# Patient Record
Sex: Male | Born: 1955
Health system: Southern US, Community
[De-identification: ages and names within clinical notes are randomized; demographics above are authoritative.]

## PROBLEM LIST (undated history)

## (undated) DIAGNOSIS — I509 Heart failure, unspecified: Secondary | ICD-10-CM

## (undated) DIAGNOSIS — I10 Essential (primary) hypertension: Secondary | ICD-10-CM

## (undated) DIAGNOSIS — D494 Neoplasm of unspecified behavior of bladder: Secondary | ICD-10-CM

## (undated) HISTORY — PX: HERNIA REPAIR: SHX51

## (undated) HISTORY — DX: Essential (primary) hypertension: I10

## (undated) HISTORY — DX: Heart failure, unspecified: I50.9

## (undated) HISTORY — DX: Neoplasm of unspecified behavior of bladder: D49.4

## (undated) HISTORY — PX: CARDIAC CATHETERIZATION: SHX172

---

## 2019-07-01 ENCOUNTER — Other Ambulatory Visit: Payer: Self-pay

## 2019-07-01 DIAGNOSIS — Z20822 Contact with and (suspected) exposure to covid-19: Secondary | ICD-10-CM

## 2019-07-03 LAB — NOVEL CORONAVIRUS, NAA: SARS-CoV-2, NAA: NOT DETECTED

## 2019-07-05 ENCOUNTER — Telehealth: Payer: Self-pay

## 2019-07-05 NOTE — Telephone Encounter (Signed)
Pt aware covid lab test negative, not detected °

## 2019-08-26 ENCOUNTER — Other Ambulatory Visit: Payer: Self-pay

## 2019-08-26 DIAGNOSIS — Z20822 Contact with and (suspected) exposure to covid-19: Secondary | ICD-10-CM

## 2019-08-28 LAB — NOVEL CORONAVIRUS, NAA: SARS-CoV-2, NAA: NOT DETECTED

## 2019-10-23 ENCOUNTER — Ambulatory Visit: Payer: Self-pay | Attending: Internal Medicine

## 2019-10-23 DIAGNOSIS — Z20822 Contact with and (suspected) exposure to covid-19: Secondary | ICD-10-CM | POA: Insufficient documentation

## 2019-10-25 LAB — NOVEL CORONAVIRUS, NAA: SARS-CoV-2, NAA: NOT DETECTED

## 2019-12-12 DIAGNOSIS — I10 Essential (primary) hypertension: Secondary | ICD-10-CM | POA: Insufficient documentation

## 2019-12-12 DIAGNOSIS — M25569 Pain in unspecified knee: Secondary | ICD-10-CM | POA: Insufficient documentation

## 2019-12-12 DIAGNOSIS — Z720 Tobacco use: Secondary | ICD-10-CM | POA: Insufficient documentation

## 2019-12-12 DIAGNOSIS — I509 Heart failure, unspecified: Secondary | ICD-10-CM | POA: Insufficient documentation

## 2020-02-27 DIAGNOSIS — R748 Abnormal levels of other serum enzymes: Secondary | ICD-10-CM | POA: Insufficient documentation

## 2020-02-27 DIAGNOSIS — R739 Hyperglycemia, unspecified: Secondary | ICD-10-CM | POA: Insufficient documentation

## 2021-04-08 ENCOUNTER — Emergency Department (HOSPITAL_COMMUNITY)
Admission: EM | Admit: 2021-04-08 | Discharge: 2021-04-08 | Disposition: A | Discharge status: Home / Self Care | Payer: PRIVATE HEALTH INSURANCE | Attending: Emergency Medicine | Admitting: Emergency Medicine

## 2021-04-08 ENCOUNTER — Encounter (HOSPITAL_COMMUNITY): Payer: Self-pay

## 2021-04-08 ENCOUNTER — Emergency Department (HOSPITAL_COMMUNITY): Payer: PRIVATE HEALTH INSURANCE

## 2021-04-08 ENCOUNTER — Other Ambulatory Visit: Payer: Self-pay

## 2021-04-08 ENCOUNTER — Ambulatory Visit (HOSPITAL_COMMUNITY)
Admission: EM | Admit: 2021-04-08 | Discharge: 2021-04-08 | Disposition: A | Discharge status: Home / Self Care | Payer: Self-pay | Attending: Physician Assistant | Admitting: Physician Assistant

## 2021-04-08 DIAGNOSIS — I509 Heart failure, unspecified: Secondary | ICD-10-CM | POA: Insufficient documentation

## 2021-04-08 DIAGNOSIS — R319 Hematuria, unspecified: Secondary | ICD-10-CM | POA: Insufficient documentation

## 2021-04-08 DIAGNOSIS — N3001 Acute cystitis with hematuria: Secondary | ICD-10-CM

## 2021-04-08 DIAGNOSIS — R310 Gross hematuria: Secondary | ICD-10-CM | POA: Insufficient documentation

## 2021-04-08 DIAGNOSIS — I110 Hypertensive heart disease with heart failure: Secondary | ICD-10-CM | POA: Insufficient documentation

## 2021-04-08 DIAGNOSIS — F17210 Nicotine dependence, cigarettes, uncomplicated: Secondary | ICD-10-CM | POA: Insufficient documentation

## 2021-04-08 DIAGNOSIS — R109 Unspecified abdominal pain: Secondary | ICD-10-CM | POA: Insufficient documentation

## 2021-04-08 LAB — COMPREHENSIVE METABOLIC PANEL
ALT: 22 U/L (ref 0–44)
AST: 27 U/L (ref 15–41)
Albumin: 4.7 g/dL (ref 3.5–5.0)
Alkaline Phosphatase: 61 U/L (ref 38–126)
Anion gap: 9 (ref 5–15)
BUN: 12 mg/dL (ref 8–23)
CO2: 26 mmol/L (ref 22–32)
Calcium: 9.6 mg/dL (ref 8.9–10.3)
Chloride: 107 mmol/L (ref 98–111)
Creatinine, Ser: 1.24 mg/dL (ref 0.61–1.24)
GFR, Estimated: >60 mL/min (ref >60)
Glucose, Bld: 90 mg/dL (ref 70–99)
Potassium: 3.3 mmol/L — ABNORMAL LOW (ref 3.5–5.1)
Sodium: 142 mmol/L (ref 135–145)
Total Bilirubin: 0.6 mg/dL (ref 0.3–1.2)
Total Protein: 7.7 g/dL (ref 6.5–8.1)

## 2021-04-08 LAB — URINALYSIS, ROUTINE W REFLEX MICROSCOPIC

## 2021-04-08 LAB — CBC WITH DIFFERENTIAL/PLATELET
Abs Immature Granulocytes: 0.01 10*3/uL (ref 0.00–0.07)
Basophils Absolute: 0 10*3/uL (ref 0.0–0.1)
Basophils Relative: 1 %
Eosinophils Absolute: 0.2 10*3/uL (ref 0.0–0.5)
Eosinophils Relative: 4 %
HCT: 44.1 % (ref 39.0–52.0)
Hemoglobin: 14.3 g/dL (ref 13.0–17.0)
Immature Granulocytes: 0 %
Lymphocytes Relative: 54 %
Lymphs Abs: 3 10*3/uL (ref 0.7–4.0)
MCH: 29.7 pg (ref 26.0–34.0)
MCHC: 32.4 g/dL (ref 30.0–36.0)
MCV: 91.5 fL (ref 80.0–100.0)
Monocytes Absolute: 0.5 10*3/uL (ref 0.1–1.0)
Monocytes Relative: 8 %
Neutro Abs: 1.9 10*3/uL (ref 1.7–7.7)
Neutrophils Relative %: 33 %
Platelets: 211 10*3/uL (ref 150–400)
RBC: 4.82 MIL/uL (ref 4.22–5.81)
RDW: 13.6 % (ref 11.5–15.5)
WBC: 5.6 10*3/uL (ref 4.0–10.5)
nRBC: 0 % (ref 0.0–0.2)

## 2021-04-08 LAB — URINALYSIS, MICROSCOPIC (REFLEX)
Bacteria, UA: NONE SEEN
RBC / HPF: >50 RBC/hpf (ref 0–5)

## 2021-04-08 LAB — POCT URINALYSIS DIPSTICK, ED / UC
Glucose, UA: 100 mg/dL — AB
Ketones, ur: 80 mg/dL — AB
Nitrite: POSITIVE — AB
Protein, ur: >=300 mg/dL — AB
Specific Gravity, Urine: 1.01 (ref 1.005–1.030)
Urobilinogen, UA: >=8 mg/dL (ref 0.0–1.0)
pH: 8.5 — ABNORMAL HIGH (ref 5.0–8.0)

## 2021-04-08 LAB — LIPASE, BLOOD: Lipase: 31 U/L (ref 11–51)

## 2021-04-08 MED ORDER — NICOTINE 21 MG/24HR TD PT24: 21.0000 mg | MEDICATED_PATCH | Freq: Every day | TRANSDERMAL | 0 refills | Status: AC

## 2021-04-08 MED ORDER — CEPHALEXIN 500 MG PO CAPS: 500.0000 mg | ORAL_CAPSULE | Freq: Four times a day (QID) | ORAL | 0 refills | Status: DC

## 2021-04-08 MED ORDER — SODIUM CHLORIDE 0.9 % IV SOLN
1.0000 g | Freq: Once | INTRAVENOUS | Status: AC
  Administered 2021-04-08: 1 g via INTRAVENOUS
  Filled 2021-04-08: qty 10

## 2021-04-08 NOTE — ED Provider Notes (Addendum)
[facility]    CSN: [id number] Arrival date & time: 04/08/21  5462      History   Chief Complaint Chief Complaint  Patient presents with   Flank Pain   Hematuria    HPI Jared Wilson is a 65 y.o. male.   Patient here c/w "blood in urine " x 3 weeks.  He reports it worsened last night.  Prior to three weeks he'd have intermittant history of red tinted urine, with one episiode of blood clots in urine last week.  Last night / this morning around 3 am he urinated and reports heavy levels of blood.  He urinated today in clinic for UA, urine sample was largely blood staining UA strip.  He reports some lower abdominal pressure, though very mild and he's not sure it's "not my mind playing tricks on me due to the blood."  He smokes 1 PPD x 40+ years.     Past Medical History:  Diagnosis Date   Congestive heart failure (CHF) ([place])    Hypertension     There are no problems to display for this patient.   History reviewed. No pertinent surgical history.     Home Medications    Prior to Admission medications   Not on File    Family History History reviewed. No pertinent family history.  Social History Social History   Tobacco Use   Smoking status: Every Day    Packs/day: 1.00    Pack years: 0.00    Types: Cigarettes   Smokeless tobacco: Never  Substance Use Topics   Alcohol use: Not Currently   Drug use: Never     Allergies   Patient has no known allergies.   Review of Systems Review of Systems  Constitutional:  Negative for chills, fatigue and fever.  Gastrointestinal:  Negative for abdominal pain, nausea and vomiting.  Genitourinary:  Positive for hematuria. Negative for dysuria, flank pain, frequency, penile discharge, scrotal swelling, testicular pain and urgency.  Musculoskeletal:  Negative for arthralgias and myalgias.  Neurological:  Negative for dizziness, light-headedness and headaches.  Hematological:  Negative for adenopathy. Does not  bruise/bleed easily.  Psychiatric/Behavioral:  Negative for sleep disturbance.     Physical Exam Triage Vital Signs ED Triage Vitals  Enc Vitals Group     BP 04/08/21 0927 (!) 137/105     Pulse Rate 04/08/21 0927 69     Resp 04/08/21 0927 17     Temp 04/08/21 0929 98.6 F (37 C)     Temp Source 04/08/21 0927 Oral     SpO2 04/08/21 0927 97 %     Weight --      Height --      Head Circumference --      Peak Flow --      Pain Score 04/08/21 0925 5     Pain Loc --      Pain Edu? --      Excl. in [place]? --    No data found.  Updated Vital Signs BP (!) 137/105 (BP Location: Right Arm)   Pulse 69   Temp 98.6 F (37 C) (Oral)   Resp 17   SpO2 97%   Visual Acuity Right Eye Distance:   Left Eye Distance:   Bilateral Distance:    Right Eye Near:   Left Eye Near:    Bilateral Near:     Physical Exam Vitals and nursing note reviewed.  Constitutional:      General: He is not in  acute distress.    Appearance: Normal appearance. He is well-developed. He is not ill-appearing or toxic-appearing.  HENT:     Head: Normocephalic and atraumatic.     Nose: Nose normal.     Mouth/Throat:     Mouth: Mucous membranes are moist.  Eyes:     General: No scleral icterus.    Conjunctiva/sclera: Conjunctivae normal.  Pulmonary:     Effort: Pulmonary effort is normal. No respiratory distress.  Abdominal:     General: There is no distension.     Tenderness: There is no abdominal tenderness. There is no right CVA tenderness, left CVA tenderness, guarding or rebound.  Musculoskeletal:     Cervical back: Normal range of motion and neck supple. No rigidity.  Skin:    General: Skin is warm and dry.     Capillary Refill: Capillary refill takes less than 2 seconds.  Neurological:     General: No focal deficit present.     Mental Status: He is alert and oriented to person, place, and time.     Motor: No weakness.     Gait: Gait normal.  Psychiatric:        Mood and Affect: Mood normal.         Behavior: Behavior normal.     UC Treatments / Results  Labs (all labs ordered are listed, but only abnormal results are displayed) Labs Reviewed  POCT URINALYSIS DIPSTICK, ED / UC - Abnormal; Notable for the following components:      Result Value   Glucose, UA 100 (*)    Bilirubin Urine LARGE (*)    Ketones, ur 80 (*)    Hgb urine dipstick LARGE (*)    pH 8.5 (*)    Protein, ur >=300 (*)    Nitrite POSITIVE (*)    Leukocytes,Ua LARGE (*)    All other components within normal limits  URINE CULTURE  URINALYSIS, ROUTINE W REFLEX MICROSCOPIC    EKG   Radiology No results found.  Procedures Procedures (including critical care time)  Medications Ordered in UC Medications - No data to display  Initial Impression / Assessment and Plan / UC Course  I have reviewed the triage vital signs and the nursing notes.  Pertinent labs & imaging results that were available during my care of the patient were reviewed by me and considered in my medical decision making (see chart for details).     Patient has painless gross hematuria in a smoker, I discussed with patient that he needs to see urology, concern with bladder / kidney CA Upon hearing my concerns he left to go to ED for further evaluation. Though urine notes leuk and nitrates, also shows pH 8.5 ketones, and glucose.  Strip is likely falsely positive, especially in the absence of Urinary sx.  Will run urine culture, contact patient if positive. Final Clinical Impressions(s) / UC Diagnoses   Final diagnoses:  Gross hematuria   Discharge Instructions   None    ED Prescriptions   None    PDMP not reviewed this encounter.   Peri Jefferson, PA-C 04/08/21 1015    Peri Jefferson, PA-C 04/08/21 1553

## 2021-04-08 NOTE — ED Provider Notes (Signed)
[facility] DEPT Provider Note   CSN: [id number] Arrival date & time: 04/08/21  1027     History Chief Complaint  Patient presents with   Hematuria    Jared Wilson is a 65 y.o. male.  HPI  Patient presents with painless hematuria x1 month.  States initially the urine was just mildly tented pink, but in the last 2 days it has become almost entirely blood.  He was concerned yesterday when he peed thick blood and had some flank cramping.  Went to urgent care, was told to follow-up with urology.  He was concerned about possible cancer so came to the ED for further work-up.  Flank pain is intermittent, started at the same time as the painless hematuria, sharp pain that last for seconds and is alleviated by moving.  No dysuria, no penile discharge, no rashes, no fever, known vomiting, no nausea.  Patient is an everyday smoker and has hypertension for which he takes medication.  No history of kidney stones  Past Medical History:  Diagnosis Date   Congestive heart failure (CHF) ([place])    Hypertension     There are no problems to display for this patient.   Past Surgical History:  Procedure Laterality Date   HERNIA REPAIR         Family History  Problem Relation Age of Onset   Deep vein thrombosis Mother     Social History   Tobacco Use   Smoking status: Every Day    Packs/day: 1.00    Pack years: 0.00    Types: Cigarettes   Smokeless tobacco: Never  Vaping Use   Vaping Use: Never used  Substance Use Topics   Alcohol use: Not Currently   Drug use: Never    Home Medications Prior to Admission medications   Not on File    Allergies    Patient has no known allergies.  Review of Systems   Review of Systems  Constitutional:  Negative for fever.  Respiratory:  Negative for cough and shortness of breath.   Cardiovascular:  Negative for chest pain and leg swelling.  Gastrointestinal:  Negative for abdominal pain, anal bleeding,  blood in stool, constipation, diarrhea, nausea and vomiting.  Genitourinary:  Positive for flank pain and hematuria. Negative for decreased urine volume, difficulty urinating, dysuria, frequency, genital sores, penile discharge and urgency.  Neurological:  Negative for syncope and weakness.   Physical Exam Updated Vital Signs BP (!) 158/108 (BP Location: Right Arm)   Pulse 71   Temp 98.6 F (37 C) (Oral)   Resp 18   Ht 5\' 9"  (1.753 m)   Wt 89.8 kg   SpO2 98%   BMI 29.24 kg/m   Physical Exam Vitals and nursing note reviewed. Exam conducted with a chaperone present.  Constitutional:      Appearance: Normal appearance.     Comments: Well-appearing  HENT:     Head: Normocephalic and atraumatic.  Eyes:     General: No scleral icterus.       Right eye: No discharge.        Left eye: No discharge.     Extraocular Movements: Extraocular movements intact.     Pupils: Pupils are equal, round, and reactive to light.  Cardiovascular:     Rate and Rhythm: Normal rate and regular rhythm.     Pulses: Normal pulses.     Heart sounds: Normal heart sounds. No murmur heard.   No friction rub. No gallop.  Pulmonary:  Effort: Pulmonary effort is normal. No respiratory distress.     Breath sounds: Normal breath sounds.  Abdominal:     General: Abdomen is flat. Bowel sounds are normal. There is no distension.     Palpations: Abdomen is soft.     Tenderness: There is no abdominal tenderness. There is no right CVA tenderness or left CVA tenderness.     Comments: Abdomen is soft, not tender to palpation.  No guarding or rebound.  No CVA tenderness.  Skin:    General: Skin is warm and dry.     Coloration: Skin is not jaundiced.  Neurological:     Mental Status: He is alert. Mental status is at baseline.     Coordination: Coordination normal.  Psychiatric:        Mood and Affect: Mood normal.    ED Results / Procedures / Treatments   Labs (all labs ordered are listed, but only abnormal  results are displayed) Labs Reviewed - No data to display  EKG None  Radiology No results found.  Procedures Procedures   Medications Ordered in ED Medications - No data to display  ED Course  I have reviewed the triage vital signs and the nursing notes.  Pertinent labs & imaging results that were available during my care of the patient were reviewed by me and considered in my medical decision making (see chart for details).  Clinical Course as of 04/08/21 1315  Thu Apr 08, 2021  1201 Comprehensive metabolic panel(!) No electrolyte derangement.  Potassium mildly low to 3.3 but not concerning.  No AKI, no LFT changes [HS]  1202 CBC with Differential No elevated white blood cell count consistent with inflammation or inflammatory process.  Patient is not anemic. [HS]  1202 Lipase: 31 No elevated lipase consistent with pancreatitis [HS]    Clinical Course User Index [HS] Sherrill Raring, PA-C   MDM Rules/Calculators/A&P                          Patient presents with 1 month of painless hematuria.   Patient was seen at urgent care this morning.  Urine culture and UA collected at that time.  UA notable for large leukocytes, positive nitrites, red blood cells.  Given his risk factors of hypertension and cigarette use, there is concern that the painless hematuria is due to bladder cancer.  However, his urine was consistent with a UTI.  Additionally, kidney stones on the differential.  Will order lab work and CT scan for further evaluation.    Please see ED course for interpretation of labs and imaging  Patient urine from urgent care was notable for a UTI.  Given initial dose of Rocephin while in the ED and will discharge with Keflex.  CT scan shows a mass in the bladder.  Discussed results with patient and advised him to make a referral for neurology follow-up soon as possible.  Also recommended discontinuing cigarettes and prescribed nicotine patch.  Patient to continue HTN  medicine.  Return precautions were given.    Patient is stable hemodynamically and nontoxic-appearing.  No pain or nausea. He is appropriate for discharge with follow-up with urology.  Patient voiced understanding and agreement with the plan.  Discussed HPI, physical exam and plan of care for this patient with attending Isla Pence. The attending physician evaluated this patient as part of a shared visit and agrees with plan of care.   Final Clinical Impression(s) / ED Diagnoses Final diagnoses:  None    Rx / DC Orders ED Discharge Orders     None        Sherrill Raring, [place] 04/08/21 1318    Isla Pence, MD 04/08/21 1407

## 2021-04-08 NOTE — ED Triage Notes (Signed)
Pt presents with blood in urine.   States he saw a blood clot 3 weeks ago and then 1 week later, he states he saw blood in his urine.   Pt states he had not been seen before due to insurance purposes.   States yesterday and this morning he urinated blood. He denies pain.

## 2021-04-08 NOTE — Discharge Instructions (Addendum)
You were seen in the department today for hematuria.  During the work-up, we diagnosed a urinary tract infection.  The first dose was given here in the ED, the please take the antibiotic Keflex 4 times a day for the next 5 days.  Additionally, the CT scan of your bladder showed a mass.  Although we are unable to determine what that is based on this 1 image in the emergency department, it is suspicious for possible bladder cancer.  However, we cannot determine what the mass is without further evaluation.  Please call the number above and set up an appointment with urology as soon as possible.    Additionally, continue taking your blood pressure medicine and discontinue cigarette use.  I have prescribed you nicotine patch which you can use to help curb the addiction.  If things change or worsen please return back to the ED for further evaluation.  CT read as following:  IMPRESSION:  Hematocrit level in the posterior aspect of the urinary bladder  consistent with the given clinical history. There are findings  suspicious for a bladder mass in the left superolateral aspect of  the bladder as described above. Direct visualization is recommended  for further evaluation.

## 2021-04-08 NOTE — ED Triage Notes (Signed)
Patient reports blood in his urine x 2 days. Patient denies any pain. Patient denies any dysuria or urinary retention.

## 2021-04-09 LAB — URINE CULTURE: Culture: <10000 — AB

## 2021-05-03 DIAGNOSIS — I509 Heart failure, unspecified: Secondary | ICD-10-CM | POA: Insufficient documentation

## 2021-05-03 DIAGNOSIS — I10 Essential (primary) hypertension: Secondary | ICD-10-CM | POA: Insufficient documentation

## 2021-05-13 ENCOUNTER — Ambulatory Visit (INDEPENDENT_AMBULATORY_CARE_PROVIDER_SITE_OTHER): Payer: Self-pay | Admitting: Cardiology

## 2021-05-13 ENCOUNTER — Other Ambulatory Visit: Payer: Self-pay

## 2021-05-13 ENCOUNTER — Encounter: Payer: Self-pay | Admitting: Cardiology

## 2021-05-13 VITALS — BP 116/84 | HR 71 | Ht 69.0 in | Wt 200.0 lb

## 2021-05-13 DIAGNOSIS — I10 Essential (primary) hypertension: Secondary | ICD-10-CM

## 2021-05-13 DIAGNOSIS — Z7689 Persons encountering health services in other specified circumstances: Secondary | ICD-10-CM

## 2021-05-13 DIAGNOSIS — E663 Overweight: Secondary | ICD-10-CM

## 2021-05-13 DIAGNOSIS — F17200 Nicotine dependence, unspecified, uncomplicated: Secondary | ICD-10-CM

## 2021-05-13 DIAGNOSIS — Z8679 Personal history of other diseases of the circulatory system: Secondary | ICD-10-CM

## 2021-05-13 DIAGNOSIS — E8881 Metabolic syndrome: Secondary | ICD-10-CM

## 2021-05-13 MED ORDER — LOSARTAN POTASSIUM 25 MG PO TABS: 25.0000 mg | ORAL_TABLET | Freq: Every day | ORAL | 3 refills | Status: AC

## 2021-05-13 MED ORDER — CARVEDILOL 6.25 MG PO TABS: 6.2500 mg | ORAL_TABLET | Freq: Two times a day (BID) | ORAL | 3 refills | Status: AC

## 2021-05-13 MED ORDER — FUROSEMIDE 40 MG PO TABS: 40.0000 mg | ORAL_TABLET | Freq: Every day | ORAL | 3 refills | Status: AC

## 2021-05-13 NOTE — Progress Notes (Signed)
Cardiology Office Note:    Date:  05/14/2021   ID:  Jared Wilson, DOB June 17, 1956, MRN [id number]  PCP:  Pcp, No  Cardiologist:  Berniece Salines, DO  Electrophysiologist:  None   Referring MD: Davis Gourd*   Chief Complaint  Patient presents with   clearance TBD    History of Present Illness:    Jared Wilson is a 65 y.o. male here today to establish cardiac care.  History of congestive heart failure he said initially his EF was depressed but was told that he had improved, hypertension here today to establish cardiac care.  He also tells me that he will be undergoing urological surgery.   Past Medical History:  Diagnosis Date   Bladder tumor    Congestive heart failure (CHF) ([place])    Hypertension     Past Surgical History:  Procedure Laterality Date   HERNIA REPAIR      Current Medications: Current Meds  Medication Sig   cephALEXin (KEFLEX) 500 MG capsule Take 1 capsule (500 mg total) by mouth 4 (four) times daily.   nicotine (NICODERM CQ - DOSED IN MG/24 HOURS) 21 mg/24hr patch Place 1 patch (21 mg total) onto the skin daily.   [DISCONTINUED] carvedilol (COREG) 6.25 MG tablet Take 6.25 mg by mouth 2 (two) times daily.   [DISCONTINUED] furosemide (LASIX) 40 MG tablet Take 40 mg by mouth daily.   [DISCONTINUED] losartan (COZAAR) 25 MG tablet Take 25 mg by mouth daily.     Allergies:   Patient has no known allergies.   Social History   Socioeconomic History   Marital status: Single    Spouse name: Not on file   Number of children: Not on file   Years of education: Not on file   Highest education level: Not on file  Occupational History   Not on file  Tobacco Use   Smoking status: Every Day    Packs/day: 1.00    Types: Cigarettes   Smokeless tobacco: Never  Vaping Use   Vaping Use: Never used  Substance and Sexual Activity   Alcohol use: Not Currently   Drug use: Never   Sexual activity: Not on file  Other Topics Concern   Not on file  Social  History Narrative   Not on file   Social Determinants of Health   Financial Resource Strain: Not on file  Food Insecurity: Not on file  Transportation Needs: Not on file  Physical Activity: Not on file  Stress: Not on file  Social Connections: Not on file     Family History: The patient's family history includes Deep vein thrombosis in his mother.  ROS:   Review of Systems  Constitution: Negative for decreased appetite, fever and weight gain.  HENT: Negative for congestion, ear discharge, hoarse voice and sore throat.   Eyes: Negative for discharge, redness, vision loss in right eye and visual halos.  Cardiovascular: Negative for chest pain, dyspnea on exertion, leg swelling, orthopnea and palpitations.  Respiratory: Negative for cough, hemoptysis, shortness of breath and snoring.   Endocrine: Negative for heat intolerance and polyphagia.  Hematologic/Lymphatic: Negative for bleeding problem. Does not bruise/bleed easily.  Skin: Negative for flushing, nail changes, rash and suspicious lesions.  Musculoskeletal: Negative for arthritis, joint pain, muscle cramps, myalgias, neck pain and stiffness.  Gastrointestinal: Negative for abdominal pain, bowel incontinence, diarrhea and excessive appetite.  Genitourinary: Negative for decreased libido, genital sores and incomplete emptying.  Neurological: Negative for brief paralysis, focal weakness, headaches and loss  of balance.  Psychiatric/Behavioral: Negative for altered mental status, depression and suicidal ideas.  Allergic/Immunologic: Negative for HIV exposure and persistent infections.    EKGs/Labs/Other Studies Reviewed:    The following studies were reviewed today:   EKG:  The ekg ordered today demonstrates sinus rhythm, heart rate 72 bpm  Recent Labs: 04/08/2021: ALT 22; BUN 12; Creatinine, Ser 1.24; Hemoglobin 14.3; Platelets 211; Potassium 3.3; Sodium 142  Recent Lipid Panel No results found for: CHOL, TRIG, HDL,  CHOLHDL, VLDL, LDLCALC, LDLDIRECT  Physical Exam:    VS:  BP 116/84 (BP Location: Right Arm, Patient Position: Sitting)   Pulse 71   Ht '5\' 9"'$  (1.753 m)   Wt 200 lb (90.7 kg)   SpO2 95%   BMI 29.53 kg/m     Wt Readings from Last 3 Encounters:  05/13/21 200 lb (90.7 kg)  04/08/21 198 lb (89.8 kg)     GEN: Well nourished, well developed in no acute distress HEENT: Normal NECK: No JVD; No carotid bruits LYMPHATICS: No lymphadenopathy CARDIAC: S1S2 noted,RRR, no murmurs, rubs, gallops RESPIRATORY:  Clear to auscultation without rales, wheezing or rhonchi  ABDOMEN: Soft, non-tender, non-distended, +bowel sounds, no guarding. EXTREMITIES: No edema, No cyanosis, no clubbing MUSCULOSKELETAL:  No deformity  SKIN: Warm and dry NEUROLOGIC:  Alert and oriented x 3, non-focal PSYCHIATRIC:  Normal affect, good insight  ASSESSMENT:    1. Smoker   2. Hypertension, unspecified type   3. History of CHF (congestive heart failure)   4. Metabolic syndrome   5. Encounter to establish care with new doctor   6. Overweight (BMI 25.0-29.9)    PLAN:    The patient does not have any unstable cardiac conditions.  Upon evaluation today, he can achieve 4 METs or greater without anginal symptoms.  According to [facility] and AHA guidelines, he requires no further cardiac workup prior to his noncardiac surgery and should be at acceptable risk.  Our service is available as necessary in the perioperative period.  We do need to get an echocardiogram as part of his establishment of cardiac care.  This will help [place] understand what his ejection fraction is.  But this testing should not prohibit the patient from undergoing his surgery.  We will refill his cardiac medication. We will also give the patient referral for primary care.  The patient is in agreement with the above plan. The patient left the office in stable condition.  The patient will follow up in 6 months or sooner if needed.   Medication  Adjustments/Labs and Tests Ordered: Current medicines are reviewed at length with the patient today.  Concerns regarding medicines are outlined above.  Orders Placed This Encounter  Procedures   Lipid panel   Ambulatory referral to Family Practice   EKG 12-Lead   ECHOCARDIOGRAM COMPLETE   Meds ordered this encounter  Medications   carvedilol (COREG) 6.25 MG tablet    Sig: Take 1 tablet (6.25 mg total) by mouth 2 (two) times daily.    Dispense:  180 tablet    Refill:  3   furosemide (LASIX) 40 MG tablet    Sig: Take 1 tablet (40 mg total) by mouth daily.    Dispense:  90 tablet    Refill:  3   losartan (COZAAR) 25 MG tablet    Sig: Take 1 tablet (25 mg total) by mouth daily.    Dispense:  90 tablet    Refill:  3    Patient Instructions  Medication Instructions:  Your  physician recommends that you continue on your current medications as directed. Please refer to the Current Medication list given to you today. Refills sent to [place], [place].  *If you need a refill on your cardiac medications before your next appointment, please call your pharmacy*   Lab Work: Your physician recommends that you return for lab work in:  LATER: Lipids - please come fasting If you have labs (blood work) drawn today and your tests are completely normal, you will receive your results only by: [place] (if you have MyChart) OR A paper copy in the mail If you have any lab test that is abnormal or we need to change your treatment, we will call you to review the results.   Testing/Procedures: Your physician has requested that you have an echocardiogram. Echocardiography is a painless test that uses sound waves to create images of your heart. It provides your doctor with information about the size and shape of your heart and how well your heart's chambers and valves are working. This procedure takes approximately one hour. There are no restrictions  for this procedure.    Follow-Up: At [facility], you and your health needs are our priority.  As part of our continuing mission to provide you with exceptional heart care, we have created designated Provider Care Teams.  These Care Teams include your primary Cardiologist (physician) and Advanced Practice Providers (APPs -  Physician Assistants and Nurse Practitioners) who all work together to provide you with the care you need, when you need it.  We recommend signing up for the patient portal called "MyChart".  Sign up information is provided on this After Visit Summary.  MyChart is used to connect with patients for Virtual Visits (Telemedicine).  Patients are able to view lab/test results, encounter notes, upcoming appointments, etc.  Non-urgent messages can be sent to your provider as well.   To learn more about what you can do with MyChart, go to [email or web address].    Your next appointment:   6 month(s)  The format for your next appointment:   In Person  Provider:   [facility] [address], [place], [place] [address]    Other Instructions Echocardiogram An echocardiogram is a test that uses sound waves (ultrasound) to produce images of the heart. Images from an echocardiogram can provide important information about: Heart size and shape. The size and thickness and movement of your heart's walls. Heart muscle function and strength. Heart valve function or if you have stenosis. Stenosis is when the heart valves are too narrow. If blood is flowing backward through the heart valves (regurgitation). A tumor or infectious growth around the heart valves. Areas of heart muscle that are not working well because of poor blood flow or injury from a heart attack. Aneurysm detection. An aneurysm is a weak or damaged part of an artery wall. The wall bulges out from the normal force of blood pumping through the body. Tell a health care provider about: Any  allergies you have. All medicines you are taking, including vitamins, herbs, eye drops, creams, and over-the-counter medicines. Any blood disorders you have. Any surgeries you have had. Any medical conditions you have. Whether you are pregnant or may be pregnant. What are the risks? Generally, this is a safe test. However, problems may occur, including an allergic reaction to dye (contrast) that may be used during the test. What happens before the test? No specific preparation  is needed. You may eat and drink normally. What happens during the test?  You will take off your clothes from the waist up and put on a hospital gown. Electrodes or electrocardiogram (ECG)patches may be placed on your chest. The electrodes or patches are then connected to a device that monitors your heart rate and rhythm. You will lie down on a table for an ultrasound exam. A gel will be applied to your chest to help sound waves pass through your skin. A handheld device, called a transducer, will be pressed against your chest and moved over your heart. The transducer produces sound waves that travel to your heart and bounce back (or "echo" back) to the transducer. These sound waves will be captured in real-time and changed into images of your heart that can be viewed on a video monitor. The images will be recorded on a computer and reviewed by your health care provider. You may be asked to change positions or hold your breath for a short time. This makes it easier to get different views or better views of your heart. In some cases, you may receive contrast through an IV in one of your veins. This can improve the quality of the pictures from your heart. The procedure may vary among health care providers and hospitals. What can I expect after the test? You may return to your normal, everyday life, including diet, activities, andmedicines, unless your health care provider tells you not to do that. Follow these instructions at  home: It is up to you to get the results of your test. Ask your health care provider, or the department that is doing the test, when your results will be ready. Keep all follow-up visits. This is important. Summary An echocardiogram is a test that uses sound waves (ultrasound) to produce images of the heart. Images from an echocardiogram can provide important information about the size and shape of your heart, heart muscle function, heart valve function, and other possible heart problems. You do not need to do anything to prepare before this test. You may eat and drink normally. After the echocardiogram is completed, you may return to your normal, everyday life, unless your health care provider tells you not to do that. This information is not intended to replace advice given to you by your health care provider. Make sure you discuss any questions you have with your healthcare provider. Document Revised: 05/19/2020 Document Reviewed: 05/19/2020 Elsevier Patient Education  2022 [employer].    Adopting a Healthy Lifestyle.  Know what a healthy weight is for you (roughly BMI <25) and aim to maintain this   Aim for 7+ servings of fruits and vegetables daily   65-80+ fluid ounces of water or unsweet tea for healthy kidneys   Limit to max 1 drink of alcohol per day; avoid smoking/tobacco   Limit animal fats in diet for cholesterol and heart health - choose grass fed whenever available   Avoid highly processed foods, and foods high in saturated/trans fats   Aim for low stress - take time to unwind and care for your mental health   Aim for 150 min of moderate intensity exercise weekly for heart health, and weights twice weekly for bone health   Aim for 7-9 hours of sleep daily   When it comes to diets, agreement about the perfect plan isnt easy to find, even among the experts. Experts at the [employer] developed an idea known as the Healthy Eating Plate. Just  imagine a plate divided into logical, healthy portions.   The emphasis is on diet quality:   Load up on vegetables and fruits - one-half of your plate: Aim for color and variety, and remember that potatoes dont count.   Go for whole grains - one-quarter of your plate: Whole wheat, barley, wheat berries, quinoa, oats, brown rice, and foods made with them. If you want pasta, go with whole wheat pasta.   Protein power - one-quarter of your plate: Fish, chicken, beans, and nuts are all healthy, versatile protein sources. Limit red meat.   The diet, however, does go beyond the plate, offering a few other suggestions.   Use healthy plant oils, such as olive, canola, soy, corn, sunflower and peanut. Check the labels, and avoid partially hydrogenated oil, which have unhealthy trans fats.   If youre thirsty, drink water. Coffee and tea are good in moderation, but skip sugary drinks and limit milk and dairy products to one or two daily servings.   The type of carbohydrate in the diet is more important than the amount. Some sources of carbohydrates, such as vegetables, fruits, whole grains, and beans-are healthier than others.   Finally, stay active  Signed, Berniece Salines, DO  05/14/2021 4:35 PM     [facility]

## 2021-05-13 NOTE — Patient Instructions (Signed)
Medication Instructions:  Your physician recommends that you continue on your current medications as directed. Please refer to the Current Medication list given to you today. Refills sent to [place], [place].  *If you need a refill on your cardiac medications before your next appointment, please call your pharmacy*   Lab Work: Your physician recommends that you return for lab work in:  LATER: Lipids - please come fasting If you have labs (blood work) drawn today and your tests are completely normal, you will receive your results only by: [place] (if you have MyChart) OR A paper copy in the mail If you have any lab test that is abnormal or we need to change your treatment, we will call you to review the results.   Testing/Procedures: Your physician has requested that you have an echocardiogram. Echocardiography is a painless test that uses sound waves to create images of your heart. It provides your doctor with information about the size and shape of your heart and how well your heart's chambers and valves are working. This procedure takes approximately one hour. There are no restrictions for this procedure.    Follow-Up: At [facility], you and your health needs are our priority.  As part of our continuing mission to provide you with exceptional heart care, we have created designated Provider Care Teams.  These Care Teams include your primary Cardiologist (physician) and Advanced Practice Providers (APPs -  Physician Assistants and Nurse Practitioners) who all work together to provide you with the care you need, when you need it.  We recommend signing up for the patient portal called "MyChart".  Sign up information is provided on this After Visit Summary.  MyChart is used to connect with patients for Virtual Visits (Telemedicine).  Patients are able to view lab/test results, encounter notes, upcoming appointments, etc.  Non-urgent  messages can be sent to your provider as well.   To learn more about what you can do with MyChart, go to [email or web address].    Your next appointment:   6 month(s)  The format for your next appointment:   In Person  Provider:   [facility] [address], [place], [place] [address]    Other Instructions Echocardiogram An echocardiogram is a test that uses sound waves (ultrasound) to produce images of the heart. Images from an echocardiogram can provide important information about: Heart size and shape. The size and thickness and movement of your heart's walls. Heart muscle function and strength. Heart valve function or if you have stenosis. Stenosis is when the heart valves are too narrow. If blood is flowing backward through the heart valves (regurgitation). A tumor or infectious growth around the heart valves. Areas of heart muscle that are not working well because of poor blood flow or injury from a heart attack. Aneurysm detection. An aneurysm is a weak or damaged part of an artery wall. The wall bulges out from the normal force of blood pumping through the body. Tell a health care provider about: Any allergies you have. All medicines you are taking, including vitamins, herbs, eye drops, creams, and over-the-counter medicines. Any blood disorders you have. Any surgeries you have had. Any medical conditions you have. Whether you are pregnant or may be pregnant. What are the risks? Generally, this is a safe test. However, problems may occur, including an allergic reaction to dye (contrast) that may be used during the test. What happens before the test?  No specific preparation is needed. You may eat and drink normally. What happens during the test?  You will take off your clothes from the waist up and put on a hospital gown. Electrodes or electrocardiogram (ECG)patches may be placed on your chest. The electrodes or patches are then connected to a  device that monitors your heart rate and rhythm. You will lie down on a table for an ultrasound exam. A gel will be applied to your chest to help sound waves pass through your skin. A handheld device, called a transducer, will be pressed against your chest and moved over your heart. The transducer produces sound waves that travel to your heart and bounce back (or "echo" back) to the transducer. These sound waves will be captured in real-time and changed into images of your heart that can be viewed on a video monitor. The images will be recorded on a computer and reviewed by your health care provider. You may be asked to change positions or hold your breath for a short time. This makes it easier to get different views or better views of your heart. In some cases, you may receive contrast through an IV in one of your veins. This can improve the quality of the pictures from your heart. The procedure may vary among health care providers and hospitals. What can I expect after the test? You may return to your normal, everyday life, including diet, activities, andmedicines, unless your health care provider tells you not to do that. Follow these instructions at home: It is up to you to get the results of your test. Ask your health care provider, or the department that is doing the test, when your results will be ready. Keep all follow-up visits. This is important. Summary An echocardiogram is a test that uses sound waves (ultrasound) to produce images of the heart. Images from an echocardiogram can provide important information about the size and shape of your heart, heart muscle function, heart valve function, and other possible heart problems. You do not need to do anything to prepare before this test. You may eat and drink normally. After the echocardiogram is completed, you may return to your normal, everyday life, unless your health care provider tells you not to do that. This information is not  intended to replace advice given to you by your health care provider. Make sure you discuss any questions you have with your healthcare provider. Document Revised: 05/19/2020 Document Reviewed: 05/19/2020 Elsevier Patient Education  2022 [employer].

## 2021-05-14 DIAGNOSIS — E8881 Metabolic syndrome: Secondary | ICD-10-CM | POA: Insufficient documentation

## 2021-05-14 DIAGNOSIS — E663 Overweight: Secondary | ICD-10-CM | POA: Insufficient documentation

## 2021-05-14 DIAGNOSIS — Z7689 Persons encountering health services in other specified circumstances: Secondary | ICD-10-CM | POA: Insufficient documentation

## 2021-05-14 DIAGNOSIS — F17200 Nicotine dependence, unspecified, uncomplicated: Secondary | ICD-10-CM | POA: Insufficient documentation

## 2021-05-14 DIAGNOSIS — Z8679 Personal history of other diseases of the circulatory system: Secondary | ICD-10-CM | POA: Insufficient documentation

## 2021-06-07 ENCOUNTER — Other Ambulatory Visit (HOSPITAL_COMMUNITY): Payer: Self-pay

## 2021-07-26 ENCOUNTER — Telehealth: Payer: Self-pay | Admitting: Cardiology

## 2021-07-26 ENCOUNTER — Other Ambulatory Visit: Payer: Self-pay | Admitting: Urology

## 2021-07-26 NOTE — Telephone Encounter (Signed)
   [facility] Pre-operative Risk Assessment    Patient Name: Jared Wilson  DOB: [birth year] MRN: [id number]  HEARTCARE STAFF:  - IMPORTANT!!!!!! Under Visit Info/Reason for Call, type in Other and utilize the format Clearance MM/DD/YY or Clearance TBD. Do not use dashes or single digits. - Please review there is not already an duplicate clearance open for this procedure. - If request is for dental extraction, please clarify the # of teeth to be extracted. - If the patient is currently at the dentist's office, call Pre-Op Callback Staff (MA/nurse) to input urgent request.  - If the patient is not currently in the dentist office, please route to the Pre-Op pool.  Request for surgical clearance:  What type of surgery is being performed? transurethral resection of bladder tumor  When is this surgery scheduled? TBD  What type of clearance is required (medical clearance vs. Pharmacy clearance to hold med vs. Both)? Medical   Are there any medications that need to be held prior to surgery and how long? N/a  Practice name and name of physician performing surgery? [facility]/ [facility]   What is the office phone number? [phone] ext. [phone]   7.   What is the office fax number? [phone]  8.   Anesthesia type (None, local, MAC, general) ? General    Jared Wilson 07/26/2021, 10:28 AM  _________________________________________________________________   (provider comments below)

## 2021-07-26 NOTE — Patient Instructions (Addendum)
DUE TO COVID-19 ONLY ONE VISITOR IS ALLOWED TO COME WITH YOU AND STAY IN THE WAITING ROOM ONLY DURING PRE OP AND PROCEDURE.   **NO VISITORS ARE ALLOWED IN THE SHORT STAY AREA OR RECOVERY ROOM!!**   Your procedure is scheduled on: Wednesday, Oct. 26, 2022   Report to [facility] Main Entrance    Report to front desk at 5:15 AM   Call this number if you have problems the morning of surgery [phone]   Do not eat food :After Midnight.   May have liquids until  4:30 AM  day of surgery  CLEAR LIQUID DIET  Foods Allowed                                                                     Foods Excluded  Water, Black Coffee and tea, regular and decaf                             liquids that you cannot  Plain Jell-O in any flavor  (No red)                                           see through such as: Fruit ices (not with fruit pulp)                                     milk, soups, orange juice              Iced Popsicles (No red)                                    All solid food                                   Apple juices Sports drinks like Gatorade (No red) Lightly seasoned clear broth or consume(fat free) Sugar, honey syrup     Oral Hygiene is also important to reduce your risk of infection.                                    Remember - BRUSH YOUR TEETH THE MORNING OF SURGERY WITH YOUR REGULAR TOOTHPASTE   Do NOT smoke after Midnight   Take these medicines the morning of surgery with A SIP OF WATER: Carvedilol                              You may not have any metal on your body including hair pins, jewelry, and body piercing             Do not wear lotions, powders, perfumes/cologne, or deodorant              Men may shave face and neck.  Do not bring valuables to the hospital. [place].   Contacts, dentures or bridgework may not be worn into surgery.    Patients discharged on the day of surgery will not be allowed to drive  home.  Please read over the following fact sheets you were given: IF YOU HAVE QUESTIONS ABOUT YOUR PRE-OP INSTRUCTIONS PLEASE CALL [place] - Preparing for Surgery Before surgery, you can play an important role.  Because skin is not sterile, your skin needs to be as free of germs as possible.  You can reduce the number of germs on your skin by washing with CHG (chlorahexidine gluconate) soap before surgery.  CHG is an antiseptic cleaner which kills germs and bonds with the skin to continue killing germs even after washing. Please DO NOT use if you have an allergy to CHG or antibacterial soaps.  If your skin becomes reddened/irritated stop using the CHG and inform your nurse when you arrive at Short Stay. Do not shave (including legs and underarms) for at least 48 hours prior to the first CHG shower.  You may shave your face/neck.  Please follow these instructions carefully:  1.  Shower with CHG Soap the night before surgery and the  morning of surgery.  2.  If you choose to wash your hair, wash your hair first as usual with your normal  shampoo.  3.  After you shampoo, rinse your hair and body thoroughly to remove the shampoo.                             4.  Use CHG as you would any other liquid soap.  You can apply chg directly to the skin and wash.  Gently with a scrungie or clean washcloth.  5.  Apply the CHG Soap to your body ONLY FROM THE NECK DOWN.   Do   not use on face/ open                           Wound or open sores. Avoid contact with eyes, ears mouth and   genitals (private parts).                       Wash face,  Genitals (private parts) with your normal soap.             6.  Wash thoroughly, paying special attention to the area where your    surgery  will be performed.  7.  Thoroughly rinse your body with warm water from the neck down.  8.  DO NOT shower/wash with your normal soap after using and rinsing off the CHG Soap.                9.  Pat yourself dry  with a clean towel.            10.  Wear clean pajamas.            11.  Place clean sheets on your bed the night of your first shower and do not  sleep with pets. Day of Surgery : Do not apply any lotions/deodorants the morning of surgery.  Please wear clean clothes to the hospital/surgery center.  FAILURE TO FOLLOW THESE INSTRUCTIONS MAY RESULT IN THE CANCELLATION OF YOUR SURGERY  PATIENT SIGNATURE_________________________________  NURSE SIGNATURE__________________________________  ________________________________________________________________________

## 2021-07-27 ENCOUNTER — Other Ambulatory Visit: Payer: Self-pay

## 2021-07-27 ENCOUNTER — Encounter (HOSPITAL_COMMUNITY): Payer: Self-pay

## 2021-07-27 ENCOUNTER — Encounter (HOSPITAL_COMMUNITY)
Admission: RE | Admit: 2021-07-27 | Discharge: 2021-07-27 | Disposition: A | Discharge status: Home / Self Care | Payer: Managed Care, Other (non HMO) | Source: Ambulatory Visit | Attending: Urology | Admitting: Urology

## 2021-07-27 DIAGNOSIS — Z01812 Encounter for preprocedural laboratory examination: Secondary | ICD-10-CM | POA: Diagnosis present

## 2021-07-27 LAB — BASIC METABOLIC PANEL
Anion gap: 5 (ref 5–15)
BUN: 14 mg/dL (ref 8–23)
CO2: 28 mmol/L (ref 22–32)
Calcium: 8.7 mg/dL — ABNORMAL LOW (ref 8.9–10.3)
Chloride: 109 mmol/L (ref 98–111)
Creatinine, Ser: 1.25 mg/dL — ABNORMAL HIGH (ref 0.61–1.24)
GFR, Estimated: >60 mL/min (ref >60)
Glucose, Bld: 104 mg/dL — ABNORMAL HIGH (ref 70–99)
Potassium: 3.3 mmol/L — ABNORMAL LOW (ref 3.5–5.1)
Sodium: 142 mmol/L (ref 135–145)

## 2021-07-27 LAB — CBC
HCT: 42.6 % (ref 39.0–52.0)
Hemoglobin: 13.7 g/dL (ref 13.0–17.0)
MCH: 30.6 pg (ref 26.0–34.0)
MCHC: 32.2 g/dL (ref 30.0–36.0)
MCV: 95.1 fL (ref 80.0–100.0)
Platelets: 235 10*3/uL (ref 150–400)
RBC: 4.48 MIL/uL (ref 4.22–5.81)
RDW: 14.4 % (ref 11.5–15.5)
WBC: 4.6 10*3/uL (ref 4.0–10.5)
nRBC: 0 % (ref 0.0–0.2)

## 2021-07-27 NOTE — Progress Notes (Addendum)
COVID swab appointment: N/A  COVID Vaccine Completed:  Yes x2 Date COVID Vaccine completed: Has received booster: COVID vaccine manufacturer: [employer]   Date of COVID positive in last 90 days:  N/A  PCP - No PCP Cardiologist -  Godfrey Pick Tobb, DO  Chest x-ray - N/A EKG - 05-13-21 Epic Stress Test - greater than 10 years [place] ECHO - 08-09-21 scheduled Cardiac Cath - greater than 10 years [place] Pacemaker/ICD device last checked: Spinal Cord Stimulator:  Sleep Study - N/A CPAP -   Fasting Blood Sugar - N/A Checks Blood Sugar _____ times a day  Blood Thinner Instructions: N/A Aspirin Instructions: Last Dose:  Activity level:  Can go up a flight of stairs and perform activities of daily living without stopping and without symptoms of chest pain or shortness of breath.  Able to exercise without symptoms, patient is a Airline pilot     Anesthesia review:  CHF, HTN  Patient denies shortness of breath, fever, cough and chest pain at PAT appointment   Patient verbalized understanding of instructions that were given to them at the PAT appointment. Patient was also instructed that they will need to review over the PAT instructions again at home before surgery.

## 2021-07-27 NOTE — Telephone Encounter (Signed)
   Name: Jared Wilson  DOB: May 04, 1956  MRN: [id number]   Primary Cardiologist: Berniece Salines, DO  Chart reviewed as part of pre-operative protocol coverage. Patient was contacted 07/27/2021 in reference to pre-operative risk assessment for pending surgery as outlined below.  Jawann Urbani was last seen on 05/13/2021 by Dr. Harriet Masson.  Since that day, Vasil Juhasz has done well without significant chest pain or shortness of breath.  Therefore, based on ACC/AHA guidelines, the patient would be at acceptable risk for the planned procedure without further cardiovascular testing.   The patient was advised that if he develops new symptoms prior to surgery to contact our office to arrange for a follow-up visit, and he verbalized understanding.  I will route this recommendation to the requesting party via Epic fax function and remove from pre-op pool. Please call with questions.  [place], [place] 07/27/2021, 11:49 AM

## 2021-07-29 NOTE — Anesthesia Preprocedure Evaluation (Addendum)
Anesthesia Evaluation  Patient identified by MRN, date of birth, ID band Patient awake    Reviewed: Allergy & Precautions, H&P , NPO status , Patient's Chart, lab work & pertinent test results  Airway Mallampati: I  TM Distance: >3 FB Neck ROM: Full    Dental no notable dental hx. (+) Edentulous Upper, Upper Dentures, Partial Lower, Poor Dentition, Missing,    Pulmonary neg pulmonary ROS, Current Smoker and Patient abstained from smoking.,    Pulmonary exam normal breath sounds clear to auscultation       Cardiovascular Exercise Tolerance: Good hypertension, Pt. on medications +CHF  negative cardio ROS Normal cardiovascular exam Rhythm:Regular Rate:Normal     Neuro/Psych negative neurological ROS  negative psych ROS   GI/Hepatic negative GI ROS, Neg liver ROS,   Endo/Other  negative endocrine ROS  Renal/GU negative Renal ROS  negative genitourinary   Musculoskeletal negative musculoskeletal ROS (+)   Abdominal   Peds negative pediatric ROS (+)  Hematology negative hematology ROS (+)   Anesthesia Other Findings   Reproductive/Obstetrics negative OB ROS                           Anesthesia Physical Anesthesia Plan  ASA: 3  Anesthesia Plan: General   Post-op Pain Management:    Induction: Intravenous  PONV Risk Score and Plan: 2 and Ondansetron and Dexamethasone  Airway Management Planned: LMA and Oral ETT  Additional Equipment: None  Intra-op Plan:   Post-operative Plan: Extubation in OR  Informed Consent: I have reviewed the patients History and Physical, chart, labs and discussed the procedure including the risks, benefits and alternatives for the proposed anesthesia with the patient or authorized representative who has indicated his/her understanding and acceptance.       Plan Discussed with: CRNA, Surgeon and Anesthesiologist  Anesthesia Plan Comments: (See APP note  by AJonah Blue, FNP  Pcp, No - Cardiologist is Kardie Tobb, DO. Cleared for surgery at initial office visit 05/13/21.    LABS: Labs reviewed: Acceptable for surgery.  EKG 05/13/21: NSR   )       Anesthesia Quick Evaluation

## 2021-07-29 NOTE — Progress Notes (Signed)
Anesthesia Chart Review:   Case: [id number] Date/Time: 08/04/21 0715   Procedure: TRANSURETHRAL RESECTION OF BLADDER TUMOR (TURBT)WITH POST OPERATIVE INSTILLATION OF GEMCITABINE/ BILATERAL RETROGRADE   Anesthesia type: General   Pre-op diagnosis: BLADDER TUMOR   Location: [facility]   Surgeons: Ceasar Mons, MD       DISCUSSION: Pt is 65 years old with hx CHF, HTN  VS: BP (!) 135/95   Pulse 67   Temp 36.8 C (Oral)   Resp 18   Ht 5\' 9"  (1.753 m)   Wt 92.7 kg   SpO2 100%   BMI 30.18 kg/m   PROVIDERS: Pcp, No - Cardiologist is Kardie Tobb, DO. Cleared for surgery at initial office visit 05/13/21.    LABS: Labs reviewed: Acceptable for surgery. (all labs ordered are listed, but only abnormal results are displayed)  Labs Reviewed  BASIC METABOLIC PANEL - Abnormal; Notable for the following components:      Result Value   Potassium 3.3 (*)    Glucose, Bld 104 (*)    Creatinine, Ser 1.25 (*)    Calcium 8.7 (*)    All other components within normal limits  CBC    EKG 05/13/21: NSR   CV: Routine Echo scheduled for 08/09/21 - Dr. Terrial Rhodes note 05/13/21 indicates this is not needed before surgery   Past Medical History:  Diagnosis Date   Bladder tumor    Congestive heart failure (CHF) ([place])    Hypertension     Past Surgical History:  Procedure Laterality Date   CARDIAC CATHETERIZATION     HERNIA REPAIR      MEDICATIONS:  carvedilol (COREG) 6.25 MG tablet   cephALEXin (KEFLEX) 500 MG capsule   furosemide (LASIX) 40 MG tablet   losartan (COZAAR) 25 MG tablet   nicotine (NICODERM CQ - DOSED IN MG/24 HOURS) 21 mg/24hr patch   No current facility-administered medications for this encounter.    If no changes, I anticipate pt can proceed with surgery as scheduled.   Willeen Cass, PhD, FNP-BC [facility]/Anesthesiology Phone: [phone] 07/29/2021 11:08 AM

## 2021-08-04 ENCOUNTER — Encounter (HOSPITAL_COMMUNITY)
Admission: RE | Disposition: A | Discharge status: Home / Self Care | Payer: Self-pay | Source: Home / Self Care | Attending: Urology

## 2021-08-04 ENCOUNTER — Ambulatory Visit (HOSPITAL_COMMUNITY): Payer: Managed Care, Other (non HMO) | Admitting: Anesthesiology

## 2021-08-04 ENCOUNTER — Ambulatory Visit (HOSPITAL_COMMUNITY): Payer: Managed Care, Other (non HMO) | Admitting: Emergency Medicine

## 2021-08-04 ENCOUNTER — Encounter (HOSPITAL_COMMUNITY): Payer: Self-pay | Admitting: Urology

## 2021-08-04 ENCOUNTER — Ambulatory Visit (HOSPITAL_COMMUNITY)
Admission: RE | Admit: 2021-08-04 | Discharge: 2021-08-04 | Disposition: A | Discharge status: Home / Self Care | Payer: Managed Care, Other (non HMO) | Attending: Urology | Admitting: Urology

## 2021-08-04 ENCOUNTER — Ambulatory Visit (HOSPITAL_COMMUNITY): Payer: Managed Care, Other (non HMO)

## 2021-08-04 DIAGNOSIS — D494 Neoplasm of unspecified behavior of bladder: Secondary | ICD-10-CM | POA: Diagnosis present

## 2021-08-04 DIAGNOSIS — Z79899 Other long term (current) drug therapy: Secondary | ICD-10-CM | POA: Insufficient documentation

## 2021-08-04 DIAGNOSIS — R310 Gross hematuria: Secondary | ICD-10-CM | POA: Insufficient documentation

## 2021-08-04 DIAGNOSIS — D090 Carcinoma in situ of bladder: Secondary | ICD-10-CM | POA: Diagnosis not present

## 2021-08-04 DIAGNOSIS — F17210 Nicotine dependence, cigarettes, uncomplicated: Secondary | ICD-10-CM | POA: Diagnosis not present

## 2021-08-04 HISTORY — PX: TRANSURETHRAL RESECTION OF BLADDER TUMOR: SHX2575

## 2021-08-04 SURGERY — TURBT (TRANSURETHRAL RESECTION OF BLADDER TUMOR)
Anesthesia: General

## 2021-08-04 MED ORDER — LIDOCAINE 2% (20 MG/ML) 5 ML SYRINGE
INTRAMUSCULAR | Status: DC | PRN
  Administered 2021-08-04: 100 mg via INTRAVENOUS

## 2021-08-04 MED ORDER — CEFAZOLIN SODIUM-DEXTROSE 2-4 GM/100ML-% IV SOLN
2.0000 g | Freq: Once | INTRAVENOUS | Status: AC
  Administered 2021-08-04: 2 g via INTRAVENOUS
  Filled 2021-08-04: qty 100

## 2021-08-04 MED ORDER — BELLADONNA ALKALOIDS-OPIUM 16.2-60 MG RE SUPP
RECTAL | Status: AC
  Administered 2021-08-04: 1 via RECTAL
  Filled 2021-08-04: qty 1

## 2021-08-04 MED ORDER — EPHEDRINE SULFATE-NACL 50-0.9 MG/10ML-% IV SOSY
PREFILLED_SYRINGE | INTRAVENOUS | Status: DC | PRN
  Administered 2021-08-04 (×2): 5 mg via INTRAVENOUS

## 2021-08-04 MED ORDER — OXYBUTYNIN CHLORIDE 5 MG PO TABS: 5.0000 mg | ORAL_TABLET | Freq: Three times a day (TID) | ORAL | 1 refills | Status: AC | PRN

## 2021-08-04 MED ORDER — MIDAZOLAM HCL 5 MG/5ML IJ SOLN
INTRAMUSCULAR | Status: DC | PRN
  Administered 2021-08-04: 2 mg via INTRAVENOUS

## 2021-08-04 MED ORDER — PHENAZOPYRIDINE HCL 200 MG PO TABS: 200.0000 mg | ORAL_TABLET | Freq: Three times a day (TID) | ORAL | 0 refills | Status: AC | PRN

## 2021-08-04 MED ORDER — SODIUM CHLORIDE 0.9 % IR SOLN
Status: DC | PRN
  Administered 2021-08-04: 3000 mL via INTRAVESICAL

## 2021-08-04 MED ORDER — ACETAMINOPHEN 325 MG PO TABS: 325.0000 mg | ORAL_TABLET | ORAL | Status: DC | PRN

## 2021-08-04 MED ORDER — ACETAMINOPHEN 160 MG/5ML PO SOLN: 325.0000 mg | ORAL | Status: DC | PRN

## 2021-08-04 MED ORDER — OXYCODONE HCL 5 MG PO TABS
ORAL_TABLET | ORAL | Status: AC
  Administered 2021-08-04: 5 mg via ORAL
  Filled 2021-08-04: qty 1

## 2021-08-04 MED ORDER — MIDAZOLAM HCL 2 MG/2ML IJ SOLN
INTRAMUSCULAR | Status: AC
  Filled 2021-08-04: qty 2

## 2021-08-04 MED ORDER — ORAL CARE MOUTH RINSE: 15.0000 mL | Freq: Once | OROMUCOSAL | Status: AC

## 2021-08-04 MED ORDER — DEXAMETHASONE SODIUM PHOSPHATE 10 MG/ML IJ SOLN
INTRAMUSCULAR | Status: DC | PRN
  Administered 2021-08-04: 8 mg via INTRAVENOUS

## 2021-08-04 MED ORDER — OXYCODONE HCL 5 MG/5ML PO SOLN: 5.0000 mg | Freq: Once | ORAL | Status: AC | PRN

## 2021-08-04 MED ORDER — PROPOFOL 10 MG/ML IV BOLUS
INTRAVENOUS | Status: DC | PRN
  Administered 2021-08-04: 150 mg via INTRAVENOUS

## 2021-08-04 MED ORDER — FENTANYL CITRATE (PF) 100 MCG/2ML IJ SOLN
INTRAMUSCULAR | Status: DC | PRN
  Administered 2021-08-04: 100 ug via INTRAVENOUS

## 2021-08-04 MED ORDER — SUGAMMADEX SODIUM 200 MG/2ML IV SOLN
INTRAVENOUS | Status: DC | PRN
  Administered 2021-08-04: 200 mg via INTRAVENOUS

## 2021-08-04 MED ORDER — ONDANSETRON HCL 4 MG/2ML IJ SOLN
INTRAMUSCULAR | Status: AC
  Filled 2021-08-04: qty 2

## 2021-08-04 MED ORDER — GEMCITABINE CHEMO FOR BLADDER INSTILLATION 2000 MG
2000.0000 mg | Freq: Once | INTRAVENOUS | Status: AC
  Administered 2021-08-04: 2000 mg via INTRAVESICAL
  Filled 2021-08-04: qty 52.6

## 2021-08-04 MED ORDER — FENTANYL CITRATE PF 50 MCG/ML IJ SOSY
PREFILLED_SYRINGE | INTRAMUSCULAR | Status: AC
  Administered 2021-08-04: 25 ug via INTRAVENOUS
  Filled 2021-08-04: qty 1

## 2021-08-04 MED ORDER — BELLADONNA ALKALOIDS-OPIUM 16.2-60 MG RE SUPP: 1.0000 | Freq: Once | RECTAL | Status: AC

## 2021-08-04 MED ORDER — LACTATED RINGERS IV SOLN: INTRAVENOUS | Status: DC

## 2021-08-04 MED ORDER — DEXAMETHASONE SODIUM PHOSPHATE 10 MG/ML IJ SOLN
INTRAMUSCULAR | Status: AC
  Filled 2021-08-04: qty 1

## 2021-08-04 MED ORDER — PHENYLEPHRINE HCL (PRESSORS) 10 MG/ML IV SOLN
INTRAVENOUS | Status: AC
  Filled 2021-08-04: qty 2

## 2021-08-04 MED ORDER — TRAMADOL HCL 50 MG PO TABS: 50.0000 mg | ORAL_TABLET | Freq: Four times a day (QID) | ORAL | 0 refills | Status: AC | PRN

## 2021-08-04 MED ORDER — PROPOFOL 10 MG/ML IV BOLUS
INTRAVENOUS | Status: AC
  Filled 2021-08-04: qty 20

## 2021-08-04 MED ORDER — OXYCODONE HCL 5 MG PO TABS: 5.0000 mg | ORAL_TABLET | Freq: Once | ORAL | Status: AC | PRN

## 2021-08-04 MED ORDER — ROCURONIUM BROMIDE 10 MG/ML (PF) SYRINGE
PREFILLED_SYRINGE | INTRAVENOUS | Status: AC
  Filled 2021-08-04: qty 10

## 2021-08-04 MED ORDER — MEPERIDINE HCL 50 MG/ML IJ SOLN: 6.2500 mg | INTRAMUSCULAR | Status: DC | PRN

## 2021-08-04 MED ORDER — LIDOCAINE HCL (PF) 2 % IJ SOLN
INTRAMUSCULAR | Status: AC
  Filled 2021-08-04: qty 5

## 2021-08-04 MED ORDER — CHLORHEXIDINE GLUCONATE 0.12 % MT SOLN
15.0000 mL | Freq: Once | OROMUCOSAL | Status: AC
  Administered 2021-08-04: 15 mL via OROMUCOSAL

## 2021-08-04 MED ORDER — GEMCITABINE CHEMO FOR BLADDER INSTILLATION 2000 MG: 2000.0000 mg | Freq: Once | INTRAVENOUS | Status: DC

## 2021-08-04 MED ORDER — FENTANYL CITRATE (PF) 100 MCG/2ML IJ SOLN
INTRAMUSCULAR | Status: AC
  Filled 2021-08-04: qty 2

## 2021-08-04 MED ORDER — FENTANYL CITRATE PF 50 MCG/ML IJ SOSY
PREFILLED_SYRINGE | INTRAMUSCULAR | Status: AC
  Administered 2021-08-04: 50 ug via INTRAVENOUS
  Filled 2021-08-04: qty 1

## 2021-08-04 MED ORDER — 0.9 % SODIUM CHLORIDE (POUR BTL) OPTIME
TOPICAL | Status: DC | PRN
  Administered 2021-08-04: 1000 mL

## 2021-08-04 MED ORDER — ONDANSETRON HCL 4 MG/2ML IJ SOLN: 4.0000 mg | Freq: Once | INTRAMUSCULAR | Status: DC | PRN

## 2021-08-04 MED ORDER — ONDANSETRON HCL 4 MG/2ML IJ SOLN
INTRAMUSCULAR | Status: DC | PRN
  Administered 2021-08-04: 4 mg via INTRAVENOUS

## 2021-08-04 MED ORDER — EPHEDRINE 5 MG/ML INJ
INTRAVENOUS | Status: AC
  Filled 2021-08-04: qty 5

## 2021-08-04 MED ORDER — ROCURONIUM BROMIDE 10 MG/ML (PF) SYRINGE
PREFILLED_SYRINGE | INTRAVENOUS | Status: DC | PRN
  Administered 2021-08-04: 70 mg via INTRAVENOUS

## 2021-08-04 MED ORDER — FENTANYL CITRATE PF 50 MCG/ML IJ SOSY
25.0000 ug | PREFILLED_SYRINGE | INTRAMUSCULAR | Status: DC | PRN
  Administered 2021-08-04: 25 ug via INTRAVENOUS

## 2021-08-04 MED ORDER — IOHEXOL 300 MG/ML  SOLN
INTRAMUSCULAR | Status: DC | PRN
  Administered 2021-08-04: 10 mL

## 2021-08-04 SURGICAL SUPPLY — 19 items
BAG URINE DRAIN 2000ML AR STRL (UROLOGICAL SUPPLIES) IMPLANT
BAG URO CATCHER STRL LF (MISCELLANEOUS) ×2 IMPLANT
CATH FOLEY 2WAY SLVR  5CC 18FR (CATHETERS) ×1
CATH FOLEY 2WAY SLVR 5CC 18FR (CATHETERS) ×1 IMPLANT
CATH URET 5FR 28IN OPEN ENDED (CATHETERS) ×2 IMPLANT
DRAPE FOOT SWITCH (DRAPES) ×2 IMPLANT
ELECT REM PT RETURN 15FT ADLT (MISCELLANEOUS) ×2 IMPLANT
GLOVE SURG ENC TEXT LTX SZ7.5 (GLOVE) ×2 IMPLANT
GOWN STRL REUS W/TWL XL LVL3 (GOWN DISPOSABLE) ×2 IMPLANT
GUIDEWIRE ANG ZIPWIRE 038X150 (WIRE) ×2 IMPLANT
KIT TURNOVER KIT A (KITS) IMPLANT
LOOP CUT BIPOLAR 24F LRG (ELECTROSURGICAL) IMPLANT
MANIFOLD NEPTUNE II (INSTRUMENTS) ×2 IMPLANT
PACK CYSTO (CUSTOM PROCEDURE TRAY) ×2 IMPLANT
PENCIL SMOKE EVACUATOR (MISCELLANEOUS) IMPLANT
SYR TOOMEY IRRIG 70ML (MISCELLANEOUS)
SYRINGE TOOMEY IRRIG 70ML (MISCELLANEOUS) IMPLANT
TUBING CONNECTING 10 (TUBING) ×2 IMPLANT
TUBING UROLOGY SET (TUBING) ×2 IMPLANT

## 2021-08-04 NOTE — Anesthesia Procedure Notes (Signed)

## 2021-08-04 NOTE — H&P (Signed)
PRE-OP H&P  Office Visit Report     07/28/2021   --------------------------------------------------------------------------------   Jared Wilson  MRN: [id number]  DOB: [birth year], 65 year old Male  SSN:    PRIMARY CARE:    REFERRING:    PROVIDER:  Resident Resident  TREATING:  Ellison Hughs, M.D.  LOCATION:  [facility] [phone]     --------------------------------------------------------------------------------   CC: I have blood in my urine.  HPI: Jared Wilson is a 65 year-old male established patient who is here for blood in the urine.  He did see the blood in his urine. He has seen blood clots.   He does not have a burning sensation when he urinates. He is not currently having trouble urinating.   He is not having pain. He has not recently had unwanted weight loss.   His last U/S or CT Scan was 04/08/2021.   04/22/21: 64yoM PMH Congestive heart failure on Lasix 40, HTN, seen in ER on 04/08/21 for gross hematuria for three weeks. Had noticed a clot three weeks prior and then for one week prior to ER visit having painless gross hematuria. 1pp smoker for forty years. Underwent CT stone protocol which revealed a possible mass in the posterolateral aspect of the bladder. Hemglobin was stable at 14 at time of ER visit. his creatinine is 1.2.  Works as a travel Corporate treasurer.  Denies chemical exposures in workplace.  Denies family history of kidney, bladder, prostate disease or malignancies.   Had a cardiologist in [place] and [place] but does not have one currently.   Currently traveling and planning to be in [place] until September 17th.   07/28/2021: The patient is here today for a preop visit leading up to his TURBT on 08/04/2021 after he was found to have a bladder lesion on CT and subsequent cystoscopy. He states that since his last visit, he has had sporadic episodes of light hematuria, but nothing like what he experienced in July. He reports good force of  stream and feels like he is emptying his bladder well. He denies interval UTIs or dysuria.     ALLERGIES: None   MEDICATIONS: Coreg 6.25 mg tablet  Lasix 40 mg tablet  Losartan Potassium 25 mg tablet     GU PSH: Cystoscopy - 04/22/2021     NON-GU PSH: Hernia Repair, Left, Left lower groin- 2001     GU PMH: Gross hematuria - 04/22/2021    NON-GU PMH: Heart disease, unspecified Hypertension    FAMILY HISTORY: 2 daughters - Daughter 1 son - Son Congestive Heart Failure - Brother, Sister Diabetes - Brother pulmonary embolism - Mother   SOCIAL HISTORY: Marital Status: Divorced Preferred Language: English; Ethnicity: Not Hispanic Or Latino Current Smoking Status: Patient does not smoke anymore. Has not smoked since 04/09/2008. Smoked for 30 years. Smoked 1/2 pack per day.   Tobacco Use Assessment Completed: Used Tobacco in last 30 days? Drinks 2 caffeinated drinks per day. 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    REVIEW OF SYSTEMS:    GU Review Male:   Patient denies frequent urination, hard to postpone urination, burning/ pain with urination, get up at night to urinate, leakage of urine, stream starts and stops, trouble starting your stream, have to strain to urinate , erection problems, and penile pain.  Gastrointestinal (Upper):   Patient denies nausea, vomiting, and indigestion/ heartburn.  Gastrointestinal (Lower):   Patient denies diarrhea and constipation.  Constitutional:   Patient denies fever, night sweats, weight loss, and fatigue.  Skin:   Patient denies skin rash/ lesion and itching.  Eyes:   Patient denies blurred vision and double vision.  Ears/ Nose/ Throat:   Patient denies sore throat and sinus problems.  Hematologic/Lymphatic:   Patient denies swollen glands and easy bruising.  Cardiovascular:   Patient denies leg swelling and chest pains.  Respiratory:   Patient denies cough and shortness of breath.  Endocrine:   Patient denies excessive thirst.   Musculoskeletal:   Patient denies back pain and joint pain.  Neurological:   Patient denies headaches and dizziness.  Psychologic:   Patient denies depression and anxiety.   VITAL SIGNS:      07/28/2021 09:03 AM  Weight 208 lb / 94.35 kg  Height 69 in / 175.26 cm  BP 146/93 mmHg  Pulse 61 /min  Temperature 97.3 F / 36.2 C  BMI 30.7 kg/m   MULTI-SYSTEM PHYSICAL EXAMINATION:    Constitutional: Well-nourished. No physical deformities. Normally developed. Good grooming.  Skin: No paleness, no jaundice, no cyanosis. No lesion, no ulcer, no rash.  Neurologic / Psychiatric: Oriented to time, oriented to place, oriented to person. No depression, no anxiety, no agitation.  Musculoskeletal: Normal gait and station of head and neck.     Complexity of Data:  Records Review:   Previous Patient Records  X-Ray Review: C.T. Abdomen/Pelvis: Reviewed Films. Reviewed Report. Discussed With Patient.     PROCEDURES:          Urinalysis w/Scope Dipstick Dipstick Cont'd Micro  Color: Amber Bilirubin: Neg mg/dL WBC/hpf: 0 - 5/hpf  Appearance: Turbid Ketones: Neg mg/dL RBC/hpf: >60/hpf  Specific Gravity: 1.025 Blood: 3+ ery/uL Bacteria: Few (10-25/hpf)  pH: 6.0 Protein: 2+ mg/dL Cystals: NS (Not Seen)  Glucose: Neg mg/dL Urobilinogen: 0.2 mg/dL Casts: NS (Not Seen)    Nitrites: Neg Trichomonas: Not Present    Leukocyte Esterase: Neg leu/uL Mucous: Not Present      Epithelial Cells: NS (Not Seen)      Yeast: NS (Not Seen)      Sperm: Not Present    ASSESSMENT:      ICD-10 Details  1 GU:   Gross hematuria - R31.0 Chronic, Stable  2   Bladder tumor/neoplasm - D41.4 Chronic, Stable   PLAN:           Orders Labs Urine Culture          Schedule Return Visit/Planned Activity: Keep Scheduled Appointment          Document Letter(s):  Created for Patient: Clinical Summary         Notes:   The risks, benefits and alternatives of cystoscopy with TURBT with intravesical gemcitabine  instillation was discussed with the patient. The risks include, but are not limited to, bleeding, urinary tract infection, bladder perforation requiring prolonged catheterization and/or open bladder repair, ureteral obstruction, voiding dysfunction and the inherent risks of general anesthesia. The patient voices understanding and wishes to proceed.

## 2021-08-04 NOTE — Anesthesia Postprocedure Evaluation (Signed)
Anesthesia Post Note  Patient: Jared Wilson  Procedure(s) Performed: TRANSURETHRAL RESECTION OF BLADDER TUMOR (TURBT)WITH POST OPERATIVE INSTILLATION OF GEMCITABINE/ BILATERAL RETROGRADE     Patient location during evaluation: PACU Anesthesia Type: General Level of consciousness: awake and alert Pain management: pain level controlled Vital Signs Assessment: post-procedure vital signs reviewed and stable Respiratory status: spontaneous breathing, nonlabored ventilation, respiratory function stable and patient connected to nasal cannula oxygen Cardiovascular status: blood pressure returned to baseline and stable Postop Assessment: no apparent nausea or vomiting Anesthetic complications: no   No notable events documented.  Last Vitals:  Vitals:   08/04/21 0900 08/04/21 0904  BP: (!) 133/106 (!) 133/98  Pulse: 69   Resp: 13   Temp:    SpO2: 94% 94%    Last Pain:  Vitals:   08/04/21 0859  TempSrc:   PainSc: 9                  Jamilette Suchocki

## 2021-08-04 NOTE — Op Note (Signed)
Operative Note  Preoperative diagnosis:  1.  2.5 cm posterior bladder wall tumor  Postoperative diagnosis: 1.  2.5 cm posterior bladder wall tumor  Procedure(s): 1.  Cystoscopy with TURBT (medium) 2.  Bilateral retrograde pyelograms with intraoperative interpretation of fluoroscopic imaging 3.  Intravesical instillation of gemcitabine  Surgeon: Ellison Hughs, MD  Assistants:  None  Anesthesia:  General  Complications:  None  EBL: 10 mL  Specimens: 1.  Superficial and deep margins of posterior bladder tumor  Drains/Catheters: 1.  18 French Foley catheter  Intraoperative findings:   2.5 cm papillary bladder tumor involving the posterior bladder wall with features concerning for urothelial cell carcinoma   Indication:  Jared Wilson is a 65 y.o. male with a recent episode of gross hematuria.  He underwent a cystoscopy in the office and was found to have a 2 cm papillary bladder tumor with features concerning for urothelial cell carcinoma.  He has been consented for the above procedures, voices understanding and wishes to proceed.  Description of procedure:  After informed consent was obtained, the patient was brought to the operating room and general LMA anesthesia was administered. The patient was then placed in the dorsolithotomy position and prepped and draped in the usual sterile fashion. A timeout was performed. A 23 French rigid cystoscope was then inserted into the urethral meatus and advanced into the bladder under direct vision. A complete bladder survey revealed the findings listed above.  A 5 French ureteral catheter was then inserted into the right ureteral orifice and a retrograde pyelogram was obtained, with the findings listed above.  A Glidewire was then used to intubate the lumen of the ureteral catheter and was advanced up to the right renal pelvis, under fluoroscopic guidance.  The catheter was then removed.  A 5 French ureteral catheter was then inserted  into the left ureteral orifice and a retrograde pyelogram was obtained, with the findings listed above.  A Glidewire was then used to intubate the lumen of the ureteral catheter and was advanced up to the left renal pelvis, under fluoroscopic guidance.  The catheter was then removed.  The rigid cystoscope was then exchanged for a 26 French resectoscope with a bipolar loop working element.  The superficial margins of the posterior bladder tumor were then resected and sent off as a separate specimen for permanent section.  A deep margin down to the detrusor musculature was obtained and sent to pathology for permanent section as well.  The area of resection was extensively fulgurated until hemostasis was achieved.  There was no evidence of bladder perforation.  The resectoscope was then removed.  An 46 French Foley catheter was then inserted into the bladder with return of clear irrigant following hand irrigation.  The patient tolerated the procedure well and was transferred to the postanesthesia unit in stable condition.  While in the recovery room 2000 mg of gemcitabine in 50 mL of water was instilled in the bladder through the catheter and the catheter was plugged. This will remain indwelling for approximately one hour. It will then be drained from the bladder and the catheter will be removed and the patient discharged home.  Plan: Remove Foley catheter 1 hour after gemcitabine instillation.

## 2021-08-04 NOTE — Transfer of Care (Signed)
Immediate Anesthesia Transfer of Care Note  Patient: Jared Wilson  Procedure(s) Performed: TRANSURETHRAL RESECTION OF BLADDER TUMOR (TURBT)WITH POST OPERATIVE INSTILLATION OF GEMCITABINE/ BILATERAL RETROGRADE  Patient Location: PACU  Anesthesia Type:General  Level of Consciousness: awake, alert , oriented and patient cooperative  Airway & Oxygen Therapy: Patient Spontanous Breathing and Patient connected to face mask oxygen  Post-op Assessment: Report given to RN, Post -op Vital signs reviewed and stable and Patient moving all extremities  Post vital signs: Reviewed and stable  Last Vitals:  Vitals Value Taken Time  BP 148/86 08/04/21 0837  Temp    Pulse 85 08/04/21 0838  Resp 16 08/04/21 0838  SpO2 95 % 08/04/21 0838  Vitals shown include unvalidated device data.  Last Pain:  Vitals:   08/04/21 0614  TempSrc: Oral  PainSc: 0-No pain         Complications: No notable events documented.

## 2021-08-05 ENCOUNTER — Encounter (HOSPITAL_COMMUNITY): Payer: Self-pay | Admitting: Urology

## 2021-08-05 LAB — SURGICAL PATHOLOGY

## 2021-08-09 ENCOUNTER — Ambulatory Visit (HOSPITAL_COMMUNITY): Payer: Managed Care, Other (non HMO) | Attending: Internal Medicine

## 2021-08-27 ENCOUNTER — Ambulatory Visit (HOSPITAL_BASED_OUTPATIENT_CLINIC_OR_DEPARTMENT_OTHER): Admission: RE | Admit: 2021-08-27 | Payer: Managed Care, Other (non HMO) | Source: Ambulatory Visit

## 2021-09-08 ENCOUNTER — Other Ambulatory Visit (HOSPITAL_BASED_OUTPATIENT_CLINIC_OR_DEPARTMENT_OTHER): Payer: Managed Care, Other (non HMO)

## 2021-10-08 ENCOUNTER — Ambulatory Visit (HOSPITAL_BASED_OUTPATIENT_CLINIC_OR_DEPARTMENT_OTHER): Admission: RE | Admit: 2021-10-08 | Payer: Managed Care, Other (non HMO) | Source: Ambulatory Visit

## 2021-11-18 ENCOUNTER — Ambulatory Visit: Payer: Self-pay | Admitting: Cardiology

## 2023-02-07 IMAGING — XA DG C-ARM 1-60 MIN
3 series · 4 of 4 positions shown · non-contrast
Comparison: CT 04/08/2021

CLINICAL DATA: Bilateral retrograde study

EXAM:
DG C-ARM 1-60 MIN

[Series 2: uro standard · 2 of 2 slices shown (1 of 3)]
[im 1/2]
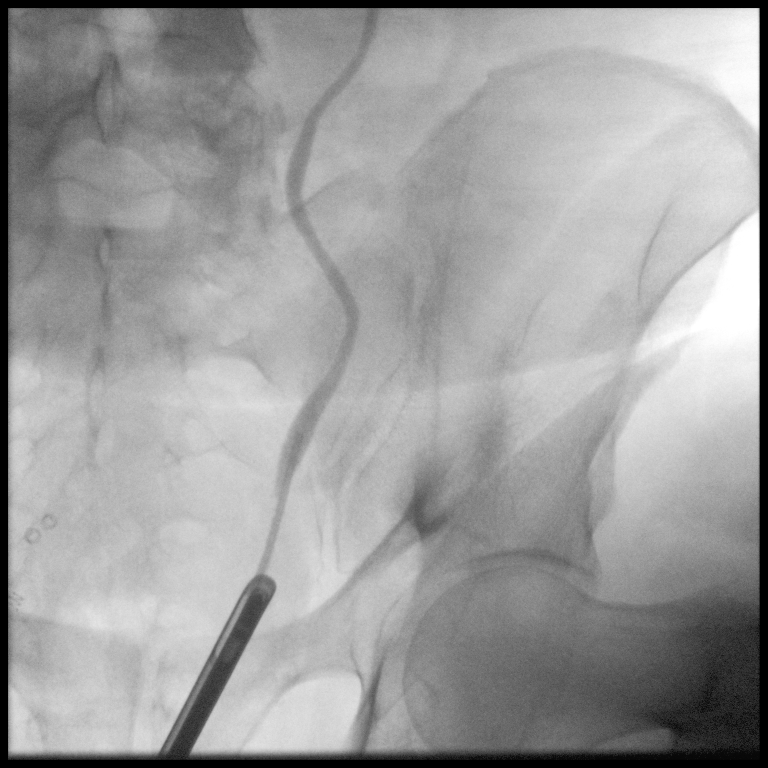
[im 2/2]
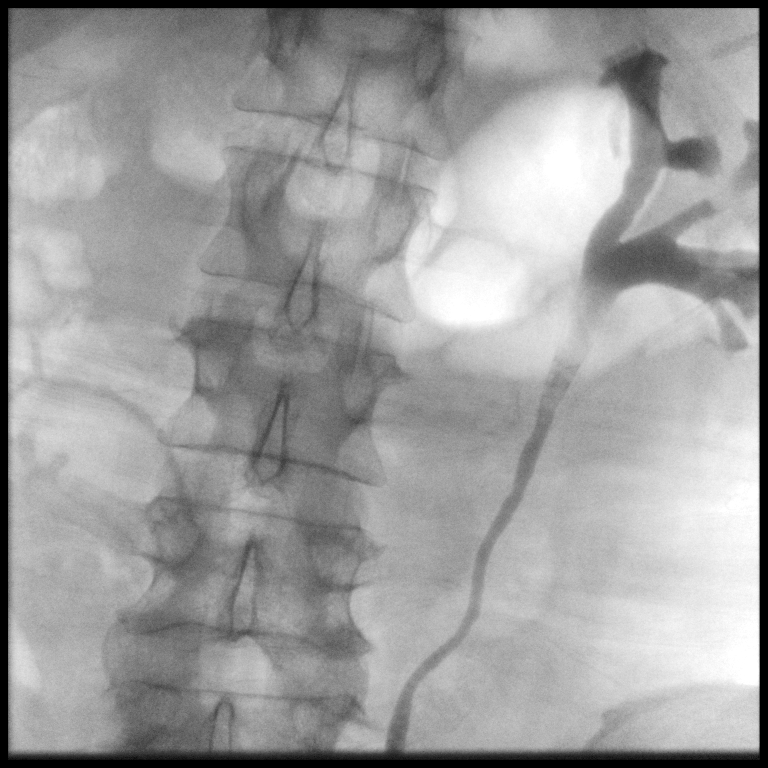

[Series 3: uro standard · 1 of 1 slices shown (2 of 3)]
[im 1/1]
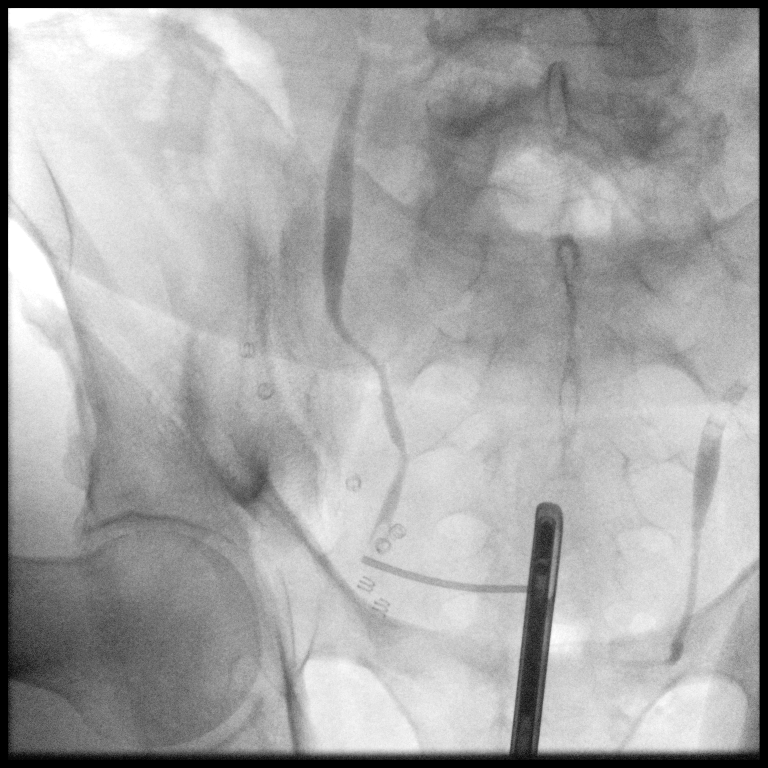

[Series 4: uro standard · 1 of 1 slices shown (3 of 3)]
[im 1/1]
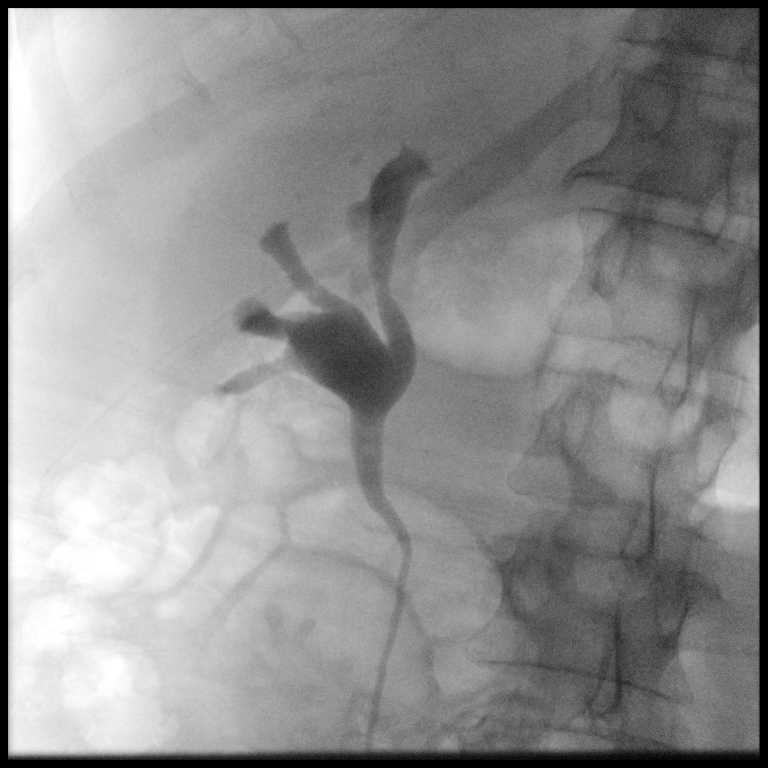

[4 of 4 positions shown; findings below may reference images not displayed]

FINDINGS: Selected C-arm images are provided. There are a few air bubbles on
both sides but no sign fixed mass or obstruction.
IMPRESSION: Negative.  Presumed air bubbles.

## 2024-09-18 ENCOUNTER — Encounter: Payer: Self-pay | Admitting: Gastroenterology

## 2024-10-28 ENCOUNTER — Ambulatory Visit: Admitting: Gastroenterology

## 2024-10-28 NOTE — Progress Notes (Unsigned)
 "  Freemon Binford [id number] 1955/11/17   Chief Complaint:  Referring Provider: No ref. provider found Primary GI MD: Unassigned  HPI: Jared Wilson is a 69 y.o. male with past medical history of bladder tumor s/p transurethral resection, CHF, HTN who presents today to discuss colonoscopy.    Discussed the use of AI scribe software for clinical note transcription with the patient, who gave verbal consent to proceed.  History of Present Illness       Previous GI Procedures/Imaging      Past Medical History:  Diagnosis Date   Bladder tumor    Congestive heart failure (CHF) (HCC)    Hypertension     Past Surgical History:  Procedure Laterality Date   CARDIAC CATHETERIZATION     HERNIA REPAIR     TRANSURETHRAL RESECTION OF BLADDER TUMOR N/A 08/04/2021   Procedure: TRANSURETHRAL RESECTION OF BLADDER TUMOR (TURBT)WITH POST OPERATIVE INSTILLATION OF GEMCITABINE / BILATERAL RETROGRADE;  Surgeon: Devere Lonni Righter, MD;  Location: [facility];  Service: Urology;  Laterality: N/A;    Current Outpatient Medications  Medication Sig Dispense Refill   carvedilol  (COREG ) 6.25 MG tablet Take 1 tablet (6.25 mg total) by mouth 2 (two) times daily. 180 tablet 3   furosemide  (LASIX ) 40 MG tablet Take 1 tablet (40 mg total) by mouth daily. 90 tablet 3   losartan  (COZAAR ) 25 MG tablet Take 1 tablet (25 mg total) by mouth daily. 90 tablet 3   nicotine  (NICODERM CQ  - DOSED IN MG/24 HOURS) 21 mg/24hr patch Place 1 patch (21 mg total) onto the skin daily. (Patient not taking: Reported on 07/26/2021) 28 patch 0   oxybutynin  (DITROPAN ) 5 MG tablet Take 1 tablet (5 mg total) by mouth every 8 (eight) hours as needed for bladder spasms. 30 tablet 1   No current facility-administered medications for this visit.    Allergies as of 10/28/2024   (No Known Allergies)    Family History  Problem Relation Age of Onset   Deep vein thrombosis Mother     Social History[1]   Review of Systems:     Constitutional: No weight loss, fever, chills, weakness or fatigue Eyes: No change in vision Ears, Nose, Throat:  No change in hearing or congestion Skin: No rash or itching Cardiovascular: No chest pain, chest pressure or palpitations   Respiratory: No SOB or cough Gastrointestinal: See HPI and otherwise negative Genitourinary: No dysuria or change in urinary frequency Neurological: No headache, dizziness or syncope Musculoskeletal: No new muscle or joint pain Hematologic: No bleeding or bruising    Physical Exam:  Vital signs: There were no vitals taken for this visit.  Wt Readings from Last 3 Encounters:  08/04/21 204 lb 6.4 oz (92.7 kg)  07/27/21 204 lb 6.4 oz (92.7 kg)  05/13/21 200 lb (90.7 kg)     Constitutional: NAD, Well developed, Well nourished, alert and cooperative Head:  Normocephalic and atraumatic.  Eyes: No scleral icterus. Conjunctiva pink. Mouth: No oral lesions. Respiratory: Respirations even and unlabored. Lungs clear to auscultation bilaterally.  No wheezes, crackles, or rhonchi.  Cardiovascular:  Regular rate and rhythm. No murmurs. No peripheral edema. Gastrointestinal:  Soft, nondistended, nontender. No rebound or guarding. Normal bowel sounds. No appreciable masses or hepatomegaly. Rectal:  Not performed.  Neurologic:  Alert and oriented x4;  grossly normal neurologically.  Skin:   Dry and intact without significant lesions or rashes. Psychiatric: Oriented to person, place and time. Demonstrates good judgement and reason without abnormal affect or behaviors.  Assessment/Plan:   Assessment & Plan        Camie Furbish, PA-C [facility] 10/28/2024, 8:21 AM  Patient Care Team: Pcp, No as PCP - General Tobb, Kardie, DO as PCP - Cardiology (Cardiology)      [1]  Social History Tobacco Use   Smoking status: Every Day    Current packs/day: 1.00    Types: Cigarettes   Smokeless tobacco: Never  Vaping Use   Vaping status:  Never Used  Substance Use Topics   Alcohol use: Yes    Comment: Once a week   Drug use: Never   "
